# Patient Record
Sex: Female | Born: 1980 | Race: White | Hispanic: No | Marital: Married | Smoking: Never smoker
Health system: Southern US, Community
[De-identification: ages and names within clinical notes are randomized; demographics above are authoritative.]

## PROBLEM LIST (undated history)

## (undated) DIAGNOSIS — K589 Irritable bowel syndrome without diarrhea: Secondary | ICD-10-CM

## (undated) HISTORY — DX: Irritable bowel syndrome, unspecified: K58.9

## (undated) HISTORY — PX: MANDIBLE FRACTURE SURGERY: SHX706

---

## 2003-03-02 ENCOUNTER — Other Ambulatory Visit: Admission: RE | Admit: 2003-03-02 | Discharge: 2003-03-02 | Payer: Self-pay | Admitting: Obstetrics and Gynecology

## 2004-03-05 ENCOUNTER — Other Ambulatory Visit: Admission: RE | Admit: 2004-03-05 | Discharge: 2004-03-05 | Payer: Self-pay | Admitting: Obstetrics and Gynecology

## 2005-03-07 ENCOUNTER — Other Ambulatory Visit: Admission: RE | Admit: 2005-03-07 | Discharge: 2005-03-07 | Payer: Self-pay | Admitting: Obstetrics & Gynecology

## 2005-05-12 HISTORY — PX: CHOLECYSTECTOMY: SHX55

## 2005-05-21 ENCOUNTER — Encounter (INDEPENDENT_AMBULATORY_CARE_PROVIDER_SITE_OTHER): Payer: Self-pay | Admitting: *Deleted

## 2005-05-21 ENCOUNTER — Ambulatory Visit (HOSPITAL_COMMUNITY): Admission: RE | Admit: 2005-05-21 | Discharge: 2005-05-21 | Payer: Self-pay | Admitting: General Surgery

## 2006-03-20 ENCOUNTER — Other Ambulatory Visit: Admission: RE | Admit: 2006-03-20 | Discharge: 2006-03-20 | Payer: Self-pay | Admitting: Obstetrics & Gynecology

## 2006-10-30 ENCOUNTER — Emergency Department (HOSPITAL_COMMUNITY): Admission: EM | Admit: 2006-10-30 | Discharge: 2006-10-30 | Payer: Self-pay | Admitting: Emergency Medicine

## 2007-03-23 ENCOUNTER — Other Ambulatory Visit: Admission: RE | Admit: 2007-03-23 | Discharge: 2007-03-23 | Payer: Self-pay | Admitting: Obstetrics & Gynecology

## 2008-03-24 ENCOUNTER — Other Ambulatory Visit: Admission: RE | Admit: 2008-03-24 | Discharge: 2008-03-24 | Payer: Self-pay | Admitting: Obstetrics & Gynecology

## 2009-04-24 IMAGING — CT CT HEAD W/O CM
4 of 6 series · 17 of 47 positions shown, 19 images · IV contrast (agent unspecified)
Comparison: none

CLINICAL DATA: Fall.  Head trauma. Neck pain. 
 HEAD CT WITHOUT CONTRAST:
TECHNIQUE: Contiguous axial images were obtained from the base of the skull through the vertex according to standard protocol without contrast.
TECHNIQUE: Multidetector CT imaging of the cervical spine was performed.  Multiplanar CT image reconstructions were also generated.

[Series 3: head trauma 4.8 h47s · axial · 0.45mm/px · z∈[-116,-27]mm · 3 of 36 slices shown]
[im 9/36  brain]
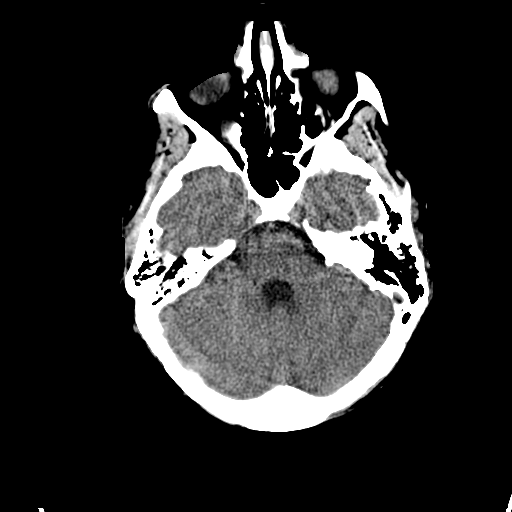
[im 18/36  brain]
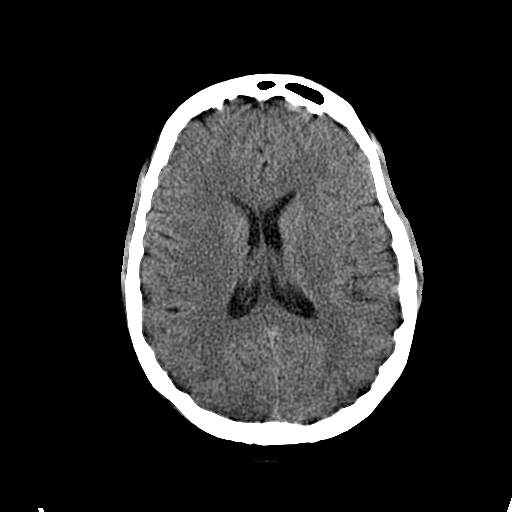
[im 27/36  brain]
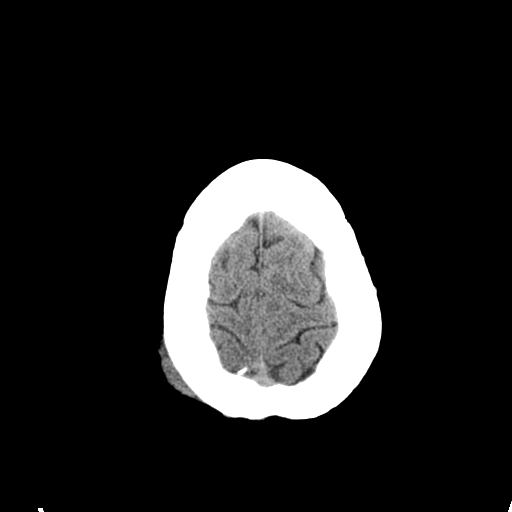

[Series 4: head trauma 2.4 h60s · axial · 0.45mm/px · z∈[-139,-1]mm · 8 of 72 slices shown, 10 images]
[im 8/72  brain]
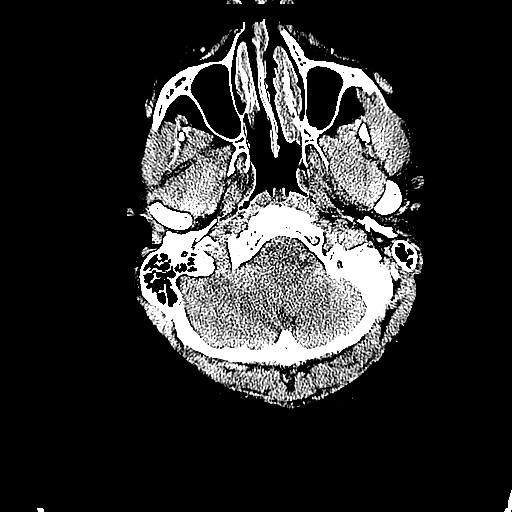
[im 8/72  bone]
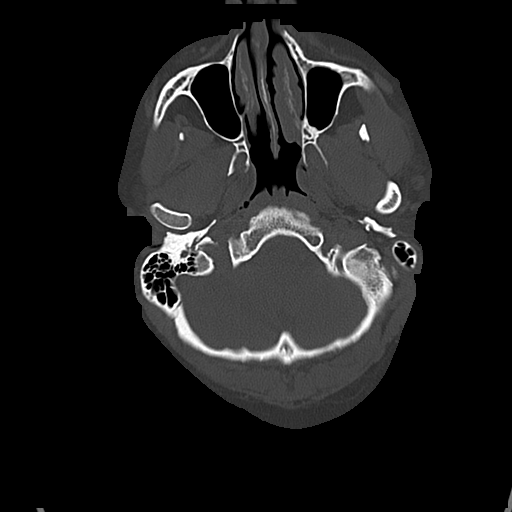
[im 16/72  brain]
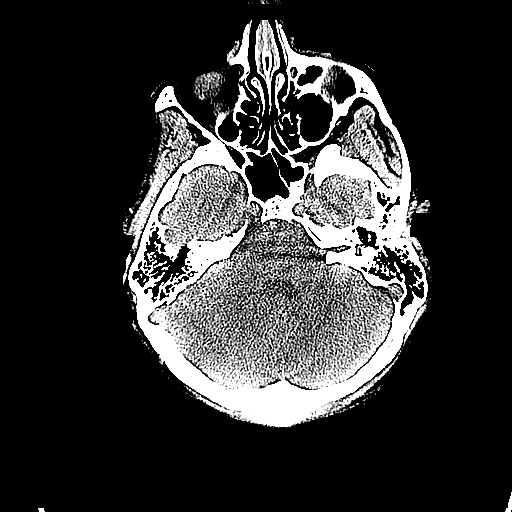
[im 24/72  brain]
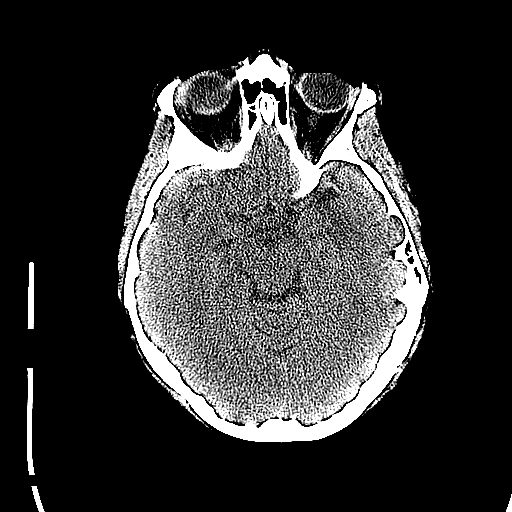
[im 32/72  brain]
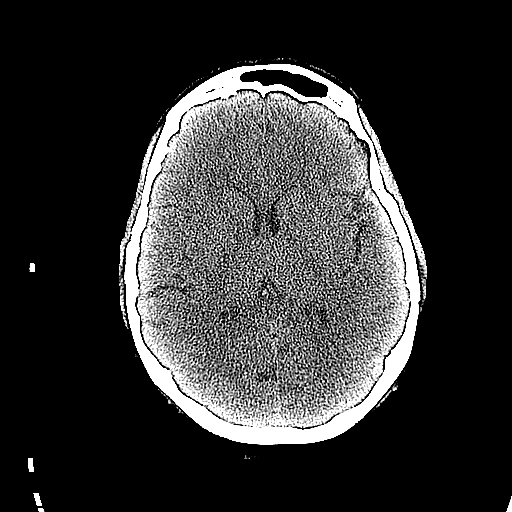
[im 40/72  brain]
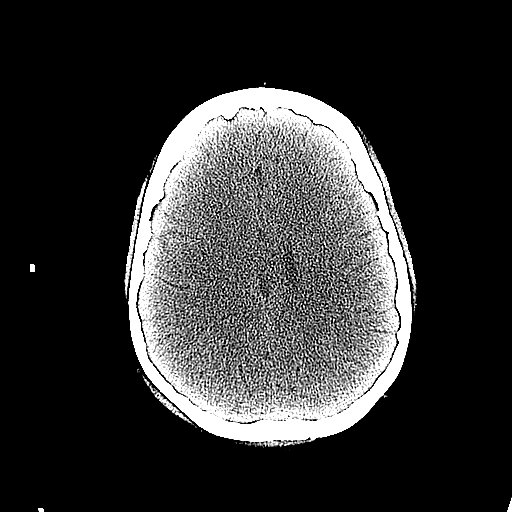
[im 40/72  bone]
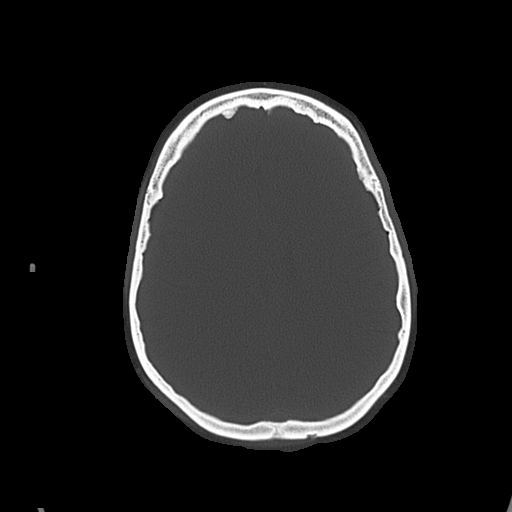
[im 48/72  brain]
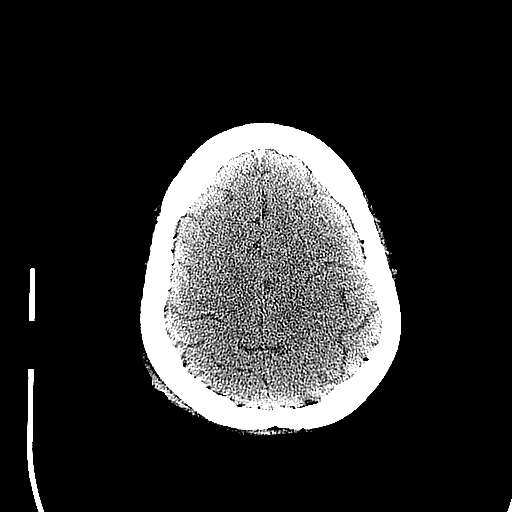
[im 56/72  brain]
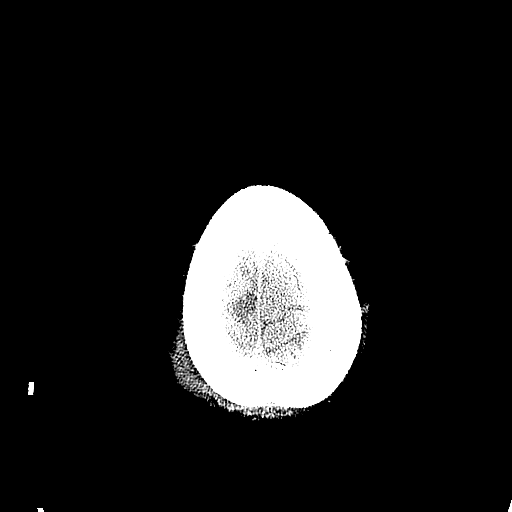
[im 64/72  brain]
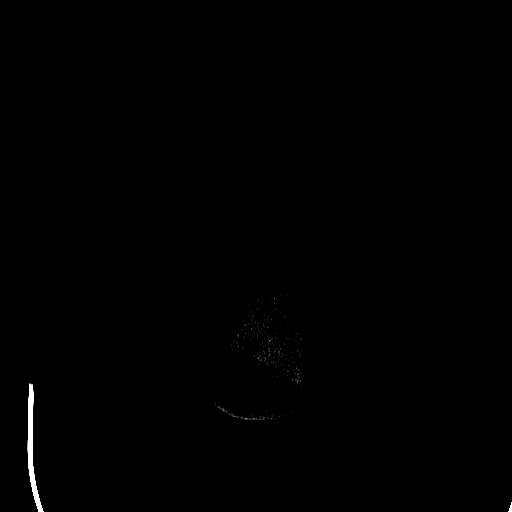

[Series 9: c_spine 2.0 spo thins · coronal · 0.43mm/px · 3 of 59 slices shown]
[im 15/59  brain]
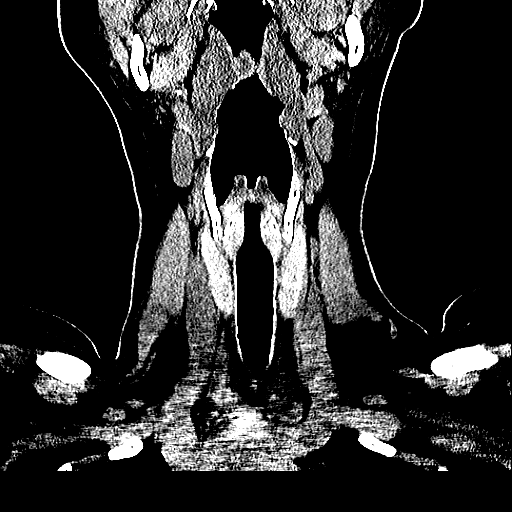
[im 30/59  brain]
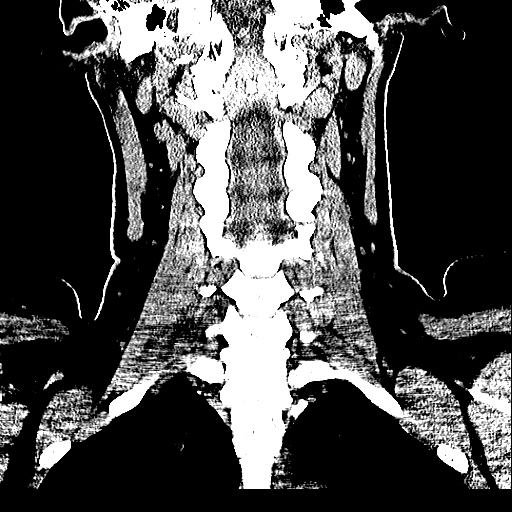
[im 44/59  brain]
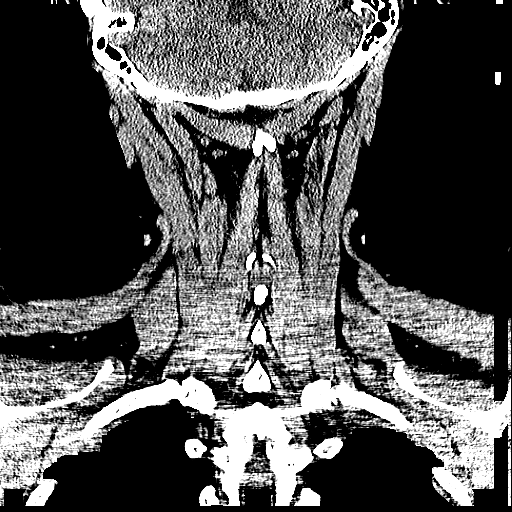

[Series 12: c_spine 2.0 spo detail · sagittal · 0.43mm/px · 3 of 59 slices shown]
[im 20/59  brain]
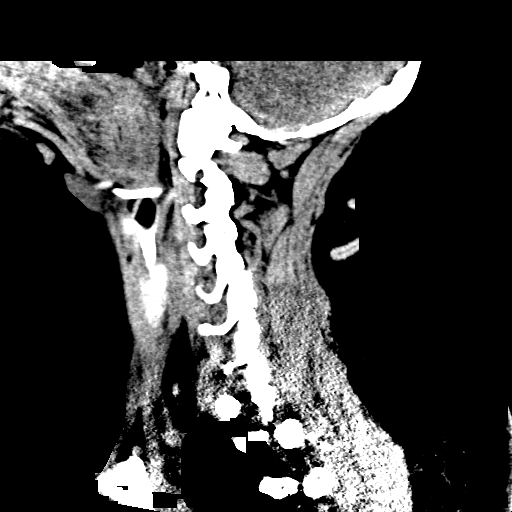
[im 30/59  brain]
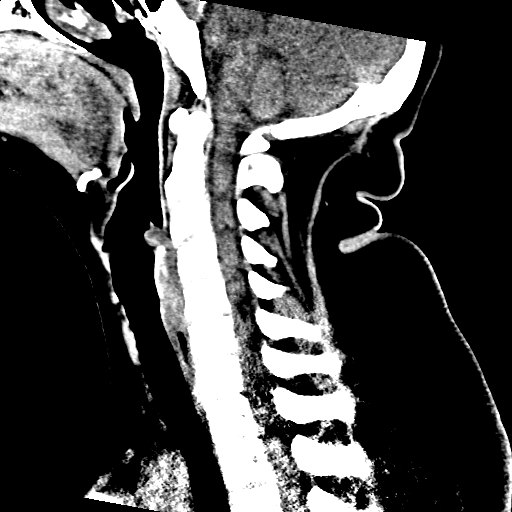
[im 39/59  brain]
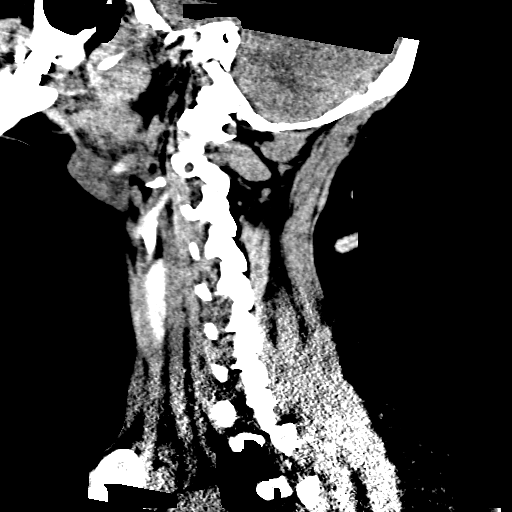

[17 of 47 positions shown; findings below may reference images not displayed]

FINDINGS: Right parietal scalp hematoma is seen.  There is no evidence of skull fracture.  There is no evidence of intracranial hemorrhage, brain edema, or other signs of infarct.  There is no evidence of intracranial mass or mass effect.  No abnormal extraaxial fluid collections are seen.  Ventricles are normal in size.
IMPRESSION: Right parietal scalp hematoma.  No evidence of skull fracture or intracranial abnormality.  
 CERVICAL SPINE CT WITHOUT CONTRAST:
FINDINGS: There is no evidence of cervical spine fracture.  Spinal alignment is normal.  No other significant bone abnormalities are identified.
IMPRESSION: No evidence of cervical spine fracture or subluxation.

## 2010-06-29 NOTE — Op Note (Signed)
Tiffany Joyce, [place]                ACCOUNT NO.:  [id number]   MEDICAL RECORD NO.:  [id number]          PATIENT TYPE:  AMB   LOCATION:  DAY                          FACILITY:  [facility]   PHYSICIAN:  Timothy E. Earlene Plater, M.D. DATE OF BIRTH:  [birth year]   DATE OF PROCEDURE:  05/21/2005  DATE OF DISCHARGE:                                 OPERATIVE REPORT   PREOPERATIVE DIAGNOSIS:  Symptomatic cholecystolithiasis.   POSTOPERATIVE DIAGNOSIS:  Symptomatic cholecystolithiasis.   PROCEDURE:  Laparoscopic cholecystectomy and operative cholangiogram.   SURGEON:  Timothy E. Earlene Plater, M.D.   ASSISTANT:  Wilmon Arms. Tsuei, M.D.   ANESTHESIA:  General.   Ms. Huckeby is 89, healthy, with symptomatic gallstones.  She has been seen  and evaluated and wishes to proceed.  She was seen, identified and a permit  signed.   She was taken to the operating room, placed supine, and general endotracheal  anesthesia administered.  The abdomen was prepped and draped in the usual  fashion.  Marcaine 0.25% with epinephrine was used throughout prior to skin  incisions.  An infraumbilical incision was made, the patient identified and  the peritoneum entered without complications.  The Hasson catheter placed,  tied in place with Vicryl, and the abdomen insufflated.  A general  peritoneoscopy was unremarkable except for obesity.  The gallbladder was  chronically inflamed with omental adhesions.  A second 10 mm trocar placed  in the midepigastric and two 5 mm trocars placed in the right upper  quadrant.  The gallbladder was grasped and the adhesions taken down bluntly  and the entire gallbladder visualized.  Stones were noted and thickening was  noted.  Careful dissection at the base of the gallbladder revealed a normal-  appearing cystic duct, which was dissected free and clear, a cystic artery  pedicle, which was dissected free and clear, and there was a full window  behind the gallbladder.  A clip was placed on the  gallbladder side of the  cystic duct and the cystic duct was opened.  A percutaneous catheter was  placed, clipped in place, and a real-time cholangiogram made showing filling  of the biliary tree and free flow into the duodenum.  No abnormalities were  noted.  The clip and catheter were removed, the cystic duct stump triply  clipped, fully divided, the cystic artery triply clipped and divided.  Dissection then was carried out, freeing the gallbladder from the  gallbladder bed.  One tear was made in the gallbladder with minimal leakage  of bile and no stones.  The gallbladder was then placed in an EndoCatch bag.  This was set aside and careful visualization of the area of dissection of  the area of the dissection, the clips and the gallbladder bed were all dry  and free of complications.  Irrigation carried out until clear.  The  gallbladder removed through the infraumbilical incision, which was then tied  and closed with a #1 Vicryl suture.  Again inspection revealed no  complication and counts were correct.  All irrigant, CO2, instruments and  trocars removed under direct vision.  All wounds were checked and they were clean and dry.  The skin incisions  closed with 3-0 Monocryl and Steri-Strips.  Dry sterile dressings applied,  final counts correct.  She tolerated it well and was extubated and taken to  the recovery room in good condition.   The patient will be seen and evaluated in the day hospital and discharged  today as planned.  Written and verbal instructions, including Percocet for  pain, were given, and she will be followed as an outpatient.      Timothy E. Earlene Plater, M.D.  Electronically Signed     TED/MEDQ  D:  05/21/2005  T:  05/21/2005  Job:  [id number]   cc:   Donia Guiles, M.D.  Fax: [phone]

## 2010-11-22 LAB — URINALYSIS, ROUTINE W REFLEX MICROSCOPIC
Hgb urine dipstick: NEGATIVE
Ketones, ur: NEGATIVE
Protein, ur: NEGATIVE
Specific Gravity, Urine: 1.03
Urobilinogen, UA: 0.2

## 2010-11-22 LAB — URINE CULTURE: Culture: NO GROWTH

## 2010-11-22 LAB — PREGNANCY, URINE: Preg Test, Ur: NEGATIVE

## 2010-11-22 LAB — I-STAT 8, (EC8 V) (CONVERTED LAB)
Acid-Base Excess: 1
Bicarbonate: 25 — ABNORMAL HIGH
HCT: 44
Hemoglobin: 15
TCO2: 26
pH, Ven: 7.437 — ABNORMAL HIGH

## 2010-11-22 LAB — URINE MICROSCOPIC-ADD ON

## 2012-10-02 ENCOUNTER — Encounter: Payer: Self-pay | Admitting: Obstetrics & Gynecology

## 2012-10-02 ENCOUNTER — Ambulatory Visit (INDEPENDENT_AMBULATORY_CARE_PROVIDER_SITE_OTHER): Payer: BC Managed Care – PPO | Admitting: Obstetrics & Gynecology

## 2012-10-02 VITALS — BP 158/88 | HR 64 | Resp 16 | Ht 67.0 in | Wt 259.8 lb

## 2012-10-02 DIAGNOSIS — Z01419 Encounter for gynecological examination (general) (routine) without abnormal findings: Secondary | ICD-10-CM

## 2012-10-02 DIAGNOSIS — Z0000 Encounter for general adult medical examination without abnormal findings: Secondary | ICD-10-CM

## 2012-10-02 LAB — HEMOGLOBIN, FINGERSTICK: Hemoglobin, fingerstick: 14.2 g/dL (ref 12.0–16.0)

## 2012-10-02 MED ORDER — NORGESTIM-ETH ESTRAD TRIPHASIC 0.18/0.215/0.25 MG-35 MCG PO TABS: 1.0000 | ORAL_TABLET | Freq: Every day | ORAL | Status: DC

## 2012-10-02 NOTE — Progress Notes (Signed)
32 y.o. G1P1 MarriedCaucasianF here for annual exam.  Son just turned 2 yo.  Full term delivery.  Son had low blood sugars and spent 6 days in NICU.  She did not officially have gestational DM.  Did have HBA1C with blood work done at work.  Advised to have yearly testing.  Considering pregnancy next year.  Cycles regular.  On OCPs.    Patient's last menstrual period was 09/10/2012.          Sexually active: yes  The current method of family planning is OCP (estrogen/progesterone).    Exercising: no  not regularly Smoker:  no  Health Maintenance: Pap:  10/01/11 WNL/negative HR HPV History of abnormal Pap:  no MMG:  none Colonoscopy:  none BMD:   Heel test at work 2013 TDaP:  2011 Screening Labs: not today, Hb today: 14.2, Urine today: WBC-+1   reports that she has never smoked. She has never used smokeless tobacco. She reports that she does not drink alcohol or use illicit drugs.  Past Medical History  Diagnosis Date  . IBS (irritable bowel syndrome)     undiagnosed    Past Surgical History  Procedure Laterality Date  . Mandible fracture surgery      reconstructive  . Cholecystectomy  4/07    Current Outpatient Prescriptions  Medication Sig Dispense Refill  . Norgestim-Eth Estrad Triphasic (ORTHO TRI-CYCLEN, 28, PO) Take by mouth daily.       No current facility-administered medications for this visit.    Family History  Problem Relation Age of Onset  . Diabetes Paternal Grandmother   . Hypertension Paternal Grandmother   . Stroke Paternal Grandmother   . Hypothyroidism Mother   . Diabetes Mother   . Diabetes Father   . Anemia Mother     ROS:  Pertinent items are noted in HPI.  Otherwise, a comprehensive ROS was negative.  Exam:   BP 158/88  Pulse 64  Resp 16  Ht 5\' 7"  (1.702 m)  Wt 259 lb 12.8 oz (117.845 kg)  BMI 40.68 kg/m2  LMP 09/10/2012  Weight change: +3lbs  Height: 5\' 7"  (170.2 cm)  Ht Readings from Last 3 Encounters:  10/02/12 5\' 7"  (1.702 m)     General appearance: alert, cooperative and appears stated age Head: Normocephalic, without obvious abnormality, atraumatic Neck: no adenopathy, supple, symmetrical, trachea midline and thyroid normal to inspection and palpation Lungs: clear to auscultation bilaterally Breasts: normal appearance, no masses or tenderness Heart: regular rate and rhythm Abdomen: soft, non-tender; bowel sounds normal; no masses,  no organomegaly Extremities: extremities normal, atraumatic, no cyanosis or edema Skin: Skin color, texture, turgor normal. No rashes or lesions Lymph nodes: Cervical, supraclavicular, and axillary nodes normal. No abnormal inguinal nodes palpated Neurologic: Grossly normal   Pelvic: External genitalia:  no lesions              Urethra:  normal appearing urethra with no masses, tenderness or lesions              Bartholins and Skenes: normal                 Vagina: normal appearing vagina with normal color and discharge, no lesions              Cervix: no lesions              Pap taken: no Bimanual Exam:  Uterus:  normal size, contour, position, consistency, mobility, non-tender  Adnexa: normal adnexa and no mass, fullness, tenderness               Rectovaginal: Confirms               Anus:  normal sphincter tone, no lesions  A:  Well Woman with normal exam On OCPs for BC H/O gestational Diabetes with pregnancy  P:   Rx for OCPs to pharamcy pap smear return annually or prn  An After Visit Summary was printed and given to the patient.

## 2012-10-02 NOTE — Patient Instructions (Signed)

## 2013-10-08 ENCOUNTER — Ambulatory Visit: Payer: BC Managed Care – PPO | Admitting: Obstetrics & Gynecology

## 2013-11-22 ENCOUNTER — Encounter: Payer: Self-pay | Admitting: Certified Nurse Midwife

## 2013-11-25 ENCOUNTER — Ambulatory Visit (INDEPENDENT_AMBULATORY_CARE_PROVIDER_SITE_OTHER): Payer: BC Managed Care – PPO | Admitting: Certified Nurse Midwife

## 2013-11-25 ENCOUNTER — Encounter: Payer: Self-pay | Admitting: Certified Nurse Midwife

## 2013-11-25 VITALS — BP 118/70 | HR 70 | Resp 16 | Ht 66.75 in | Wt 262.0 lb

## 2013-11-25 DIAGNOSIS — Z01419 Encounter for gynecological examination (general) (routine) without abnormal findings: Secondary | ICD-10-CM

## 2013-11-25 DIAGNOSIS — Z124 Encounter for screening for malignant neoplasm of cervix: Secondary | ICD-10-CM

## 2013-11-25 DIAGNOSIS — Z0000 Encounter for general adult medical examination without abnormal findings: Secondary | ICD-10-CM

## 2013-11-25 LAB — POCT URINALYSIS DIPSTICK
BILIRUBIN UA: NEGATIVE
GLUCOSE UA: NEGATIVE
KETONES UA: NEGATIVE
Leukocytes, UA: NEGATIVE
NITRITE UA: NEGATIVE
Protein, UA: NEGATIVE
RBC UA: NEGATIVE
UROBILINOGEN UA: NEGATIVE
pH, UA: 5

## 2013-11-25 LAB — HEMOGLOBIN, FINGERSTICK: Hemoglobin, fingerstick: 14.2 g/dL (ref 12.0–16.0)

## 2013-11-25 NOTE — Patient Instructions (Addendum)
EXERCISE AND DIET:  We recommended that you start or continue a regular exercise program for good health. Regular exercise means any activity that makes your heart beat faster and makes you sweat.  We recommend exercising at least 30 minutes per day at least 3 days a week, preferably 4 or 5.  We also recommend a diet low in fat and sugar.  Inactivity, poor dietary choices and obesity can cause diabetes, heart attack, stroke, and kidney damage, among others.    ALCOHOL AND SMOKING:  Women should limit their alcohol intake to no more than 7 drinks/beers/glasses of wine (combined, not each!) per week. Moderation of alcohol intake to this level decreases your risk of breast cancer and liver damage. And of course, no recreational drugs are part of a healthy lifestyle.  And absolutely no smoking or even second hand smoke. Most people know smoking can cause heart and lung diseases, but did you know it also contributes to weakening of your bones? Aging of your skin?  Yellowing of your teeth and nails?  CALCIUM AND VITAMIN D:  Adequate intake of calcium and Vitamin D are recommended.  The recommendations for exact amounts of these supplements seem to change often, but generally speaking 600 mg of calcium (either carbonate or citrate) and 800 units of Vitamin D per day seems prudent. Certain women may benefit from higher intake of Vitamin D.  If you are among these women, your doctor will have told you during your visit.    PAP SMEARS:  Pap smears, to check for cervical cancer or precancers,  have traditionally been done yearly, although recent scientific advances have shown that most women can have pap smears less often.  However, every woman still should have a physical exam from her gynecologist every year. It will include a breast check, inspection of the vulva and vagina to check for abnormal growths or skin changes, a visual exam of the cervix, and then an exam to evaluate the size and shape of the uterus and  ovaries.  And after 35 years of age, a rectal exam is indicated to check for rectal cancers. We will also provide age appropriate advice regarding health maintenance, like when you should have certain vaccines, screening for sexually transmitted diseases, bone density testing, colonoscopy, mammograms, etc.   MAMMOGRAMS:  All women over 40 years old should have a yearly mammogram. Many facilities now offer a "3D" mammogram, which may cost around $50 extra out of pocket. If possible,  we recommend you accept the option to have the 3D mammogram performed.  It both reduces the number of women who will be called back for extra views which then turn out to be normal, and it is better than the routine mammogram at detecting truly abnormal areas.    COLONOSCOPY:  Colonoscopy to screen for colon cancer is recommended for all women at age 50.  We know, you hate the idea of the prep.  We agree, BUT, having colon cancer and not knowing it is worse!!  Colon cancer so often starts as a polyp that can be seen and removed at colonscopy, which can quite literally save your life!  And if your first colonoscopy is normal and you have no family history of colon cancer, most women don't have to have it again for 10 years.  Once every ten years, you can do something that may end up saving your life, right?  We will be happy to help you get it scheduled when you are ready.    Be sure to check your insurance coverage so you understand how much it will cost.  It may be covered as a preventative service at no cost, but you should check your particular policy.     Prenatal Care  WHAT IS PRENATAL CARE?  Prenatal care means health care during your pregnancy, before your baby is born. It is very important to take care of yourself and your baby during your pregnancy by:   Getting early prenatal care. If you know you are pregnant, or think you might be pregnant, call your health care provider as soon as possible. Schedule a visit for a  prenatal exam.  Getting regular prenatal care. Follow your health care provider's schedule for blood and other necessary tests. Do not miss appointments.  Doing everything you can to keep yourself and your baby healthy during your pregnancy.  Getting complete care. Prenatal care should include evaluation of the medical, dietary, educational, psychological, and social needs of you and your significant other. The medical and genetic history of your family and the family of your baby's father should be discussed with your health care provider.  Discussing with your health care provider:  Prescription, over-the-counter, and herbal medicines that you take.  Any history of substance abuse, alcohol use, smoking, and illegal drug use.  Any history of domestic abuse and violence.  Immunizations you have received.  Your nutrition and diet.  The amount of exercise you do.  Any environmental and occupational hazards to which you are exposed.  History of sexually transmitted infections for both you and your partner.  Previous pregnancies you have had. WHY IS PRENATAL CARE SO IMPORTANT?  By regularly seeing your health care provider, you help ensure that problems can be identified early so that they can be treated as soon as possible. Other problems might be prevented. Many studies have shown that early and regular prenatal care is important for the health of mothers and their babies.  HOW CAN I TAKE CARE OF MYSELF WHILE I AM PREGNANT?  Here are ways to take care of yourself and your baby:   Start or continue taking your multivitamin with 400 micrograms (mcg) of folic acid every day.  Get early and regular prenatal care. It is very important to see a health care provider during your pregnancy. Your health care provider will check at each visit to make sure that you and your baby are healthy. If there are any problems, action can be taken right away to help you and your baby.  Eat a healthy diet  that includes:  Fruits.  Vegetables.  Foods low in saturated fat.  Whole grains.  Calcium-rich foods, such as milk, yogurt, and hard cheeses.  Drink 6-8 glasses of liquids a day.  Unless your health care provider tells you not to, try to be physically active for 30 minutes, most days of the week. If you are pressed for time, you can get your activity in through 10-minute segments, three times a day.  Do not smoke, drink alcohol, or use drugs. These can cause long-term damage to your baby. Talk with your health care provider about steps to take to stop smoking. Talk with a member of your faith community, a counselor, a trusted friend, or your health care provider if you are concerned about your alcohol or drug use.  Ask your health care provider before taking any medicine, even over-the-counter medicines. Some medicines are not safe to take during pregnancy.  Get plenty of rest and sleep.  Avoid hot tubs and saunas during pregnancy.  Do not have X-rays taken unless absolutely necessary and with the recommendation of your health care provider. A lead shield can be placed on your abdomen to protect your baby when X-rays are taken in other parts of your body.  Do not empty the cat litter when you are pregnant. It may contain a parasite that causes an infection called toxoplasmosis, which can cause birth defects. Also, use gloves when working in garden areas used by cats.  Do not eat uncooked or undercooked meats or fish.  Do not eat soft, mold-ripened cheeses (Brie, Camembert, and chevre) or soft, blue-veined cheese (Danish blue and Roquefort).  Stay away from toxic chemicals like:  Insecticides.  Solvents (some cleaners or paint thinners).  Lead.  Mercury.  Sexual intercourse may continue until the end of the pregnancy, unless you have a medical problem or there is a problem with the pregnancy and your health care provider tells you not to.  Do not wear high-heel shoes,  especially during the second half of the pregnancy. You can lose your balance and fall.  Do not take long trips, unless absolutely necessary. Be sure to see your health care provider before going on the trip.  Do not sit in one position for more than 2 hours when on a trip.  Take a copy of your medical records when going on a trip. Know where a hospital is located in the city you are visiting, in case of an emergency.  Most dangerous household products will have pregnancy warnings on their labels. Ask your health care provider about products if you are unsure.  Limit or eliminate your caffeine intake from coffee, tea, sodas, medicines, and chocolate.  Many women continue working through pregnancy. Staying active might help you stay healthier. If you have a question about the safety or the hours you work at your particular job, talk with your health care provider.  Get informed:  Read books.  Watch videos.  Go to childbirth classes for you and your significant other.  Talk with experienced moms.  Ask your health care provider about childbirth education classes for you and your partner. Classes can help you and your partner prepare for the birth of your baby.  Ask about a baby doctor (pediatrician) and methods and pain medicine for labor, delivery, and possible cesarean delivery. HOW OFTEN SHOULD I SEE MY HEALTH CARE PROVIDER DURING PREGNANCY?  Your health care provider will give you a schedule for your prenatal visits. You will have visits more often as you get closer to the end of your pregnancy. An average pregnancy lasts about 40 weeks.  A typical schedule includes visiting your health care provider:   About once each month during your first 6 months of pregnancy.  Every 2 weeks during the next 2 months.  Weekly in the last month, until the delivery date. Your health care provider will probably want to see you more often if:  You are older than 35 years.  Your pregnancy is  high risk because you have certain health problems or problems with the pregnancy, such as:  Diabetes.  High blood pressure.  The baby is not growing on schedule, according to the dates of the pregnancy. Your health care provider will do special tests to make sure you and your baby are not having any serious problems. WHAT HAPPENS DURING PRENATAL VISITS?   At your first prenatal visit, your health care provider will do a physical exam and  talk to you about your health history and the health history of your partner and your family. Your health care provider will be able to tell you what date to expect your baby to be born on.  Your first physical exam will include checks of your blood pressure, measurements of your height and weight, and an exam of your pelvic organs. Your health care provider will do a Pap test if you have not had one recently and will do cultures of your cervix to make sure there is no infection.  At each prenatal visit, there will be tests of your blood, urine, blood pressure, weight, and the progress of the baby will be checked.  At your later prenatal visits, your health care provider will check how you are doing and how your baby is developing. You may have a number of tests done as your pregnancy progresses.  Ultrasound exams are often used to check on your baby's growth and health.  You may have more urine and blood tests, as well as special tests, if needed. These may include amniocentesis to examine fluid in the pregnancy sac, stress tests to check how the baby responds to contractions, or a biophysical profile to measure your baby's well-being. Your health care provider will explain the tests and why they are necessary.  You should be tested for high blood sugar (gestational diabetes) between the 24th and 28th weeks of your pregnancy.  You should discuss with your health care provider your plans to breastfeed or bottle-feed your baby.  Each visit is also a chance  for you to learn about staying healthy during pregnancy and to ask questions. Document Released: 01/31/2003 Document Revised: 02/02/2013 Document Reviewed: 04/14/2013 [facility] Patient Information 2015 [employer], [place]. This information is not intended to replace advice given to you by your health care provider. Make sure you discuss any questions you have with your health care provider.

## 2013-11-25 NOTE — Progress Notes (Signed)
33 y.o. G1P1 Married Caucasian Fe here for annual exam. Periods normal, no issues. Trying for pregnancy, taking prenatal vitamins. Sees Dr. Laural [facility]Johnson at [facility] for [facility]. Patient excited to try again. No health issues today.   Patient's last menstrual period was 11/10/2013.          Sexually active: Yes.    The current method of family planning is none.    Exercising: No.  exercise Smoker:  no  Health Maintenance: Pap: 10-01-11 neg HPV HR neg MMG:  none Colonoscopy:  none BMD:   Heel scan test at work 2013 TDaP:  2010 Labs: Poct urine-neg, Hgb-14.2 Self breast exam: done occ   reports that she has never smoked. She has never used smokeless tobacco. She reports that she does not drink alcohol or use illicit drugs.  Past Medical History  Diagnosis Date  . IBS (irritable bowel syndrome)     undiagnosed  . SVD (spontaneous vaginal delivery) 08/06/11    Issac "Jilda PandaJared"    Past Surgical History  Procedure Laterality Date  . Mandible fracture surgery      reconstructive  . Cholecystectomy  4/07    Current Outpatient Prescriptions  Medication Sig Dispense Refill  . betamethasone dipropionate (DIPROLENE) 0.05 % cream as needed.      Marland Kitchen. UNABLE TO FIND Essential oils: some are used prn & some are daily       No current facility-administered medications for this visit.    Family History  Problem Relation Age of Onset  . Diabetes Paternal Grandmother   . Hypertension Paternal Grandmother   . Stroke Paternal Grandmother   . Hypothyroidism Mother   . Anemia Mother   . Diabetes Father     ROS:  Pertinent items are noted in HPI.  Otherwise, a comprehensive ROS was negative.  Exam:   BP 118/70  Pulse 70  Resp 16  Ht 5' 6.75" (1.695 m)  Wt 262 lb (118.842 kg)  BMI 41.36 kg/m2  LMP 11/10/2013 Height: 5' 6.75" (169.5 cm)  Ht Readings from Last 3 Encounters:  11/25/13 5' 6.75" (1.695 m)  10/02/12 5\' 7"  (1.702 m)    General appearance: alert, cooperative and appears  stated age Head: Normocephalic, without obvious abnormality, atraumatic Neck: no adenopathy, supple, symmetrical, trachea midline and thyroid normal to inspection and palpation Lungs: clear to auscultation bilaterally Breasts: normal appearance, no masses or tenderness, No nipple retraction or dimpling, No nipple discharge or bleeding, No axillary or supraclavicular adenopathy Heart: regular rate and rhythm Abdomen: soft, non-tender; no masses,  no organomegaly Extremities: extremities normal, atraumatic, no cyanosis or edema Skin: Skin color, texture, turgor normal. No rashes or lesions Lymph nodes: Cervical, supraclavicular, and axillary nodes normal. No abnormal inguinal nodes palpated Neurologic: Grossly normal   Pelvic: External genitalia:  no lesions              Urethra:  normal appearing urethra with no masses, tenderness or lesions              Bartholin's and Skene's: normal                 Vagina: normal appearing vagina with normal color and discharge, no lesions              Cervix: normal, non tender,no lesions, posterior              Pap taken: Yes.   Bimanual Exam:  Uterus:  normal size, contour, position, consistency, mobility, non-tender and anteverted  Adnexa: normal adnexa and no mass, fullness, tenderness               Rectovaginal: Confirms               Anus:  normal sphincter tone, no lesions  A:  Well Woman with normal exam  Contraception none, trying for pregnancy  P:   Reviewed health and wellness pertinent to exam  Pap smear taken today with HPV reflex  Wished well with attempting pregnancy, will advise when pregnant.   counseled on breast self exam, adequate intake of calcium and vitamin D, diet and exercise  return annually or prn  An After Visit Summary was printed and given to the patient.

## 2013-12-01 LAB — IPS PAP TEST WITH REFLEX TO HPV

## 2013-12-02 NOTE — Progress Notes (Signed)
Quick Note:  Pap smear reviewed negative 02 ______

## 2013-12-03 NOTE — Progress Notes (Signed)
Reviewed personally.  M. Suzanne Maxamillion Banas, MD.  

## 2013-12-13 ENCOUNTER — Encounter: Payer: Self-pay | Admitting: Certified Nurse Midwife

## 2014-09-28 ENCOUNTER — Telehealth: Payer: Self-pay | Admitting: Certified Nurse Midwife

## 2014-09-28 NOTE — Telephone Encounter (Signed)
LMTCB about cx appointment

## 2014-11-29 ENCOUNTER — Ambulatory Visit: Payer: Self-pay | Admitting: Certified Nurse Midwife
